# Patient Record
Sex: Male | Born: 1965 | Race: White | Hispanic: No | Marital: Single
Health system: Western US, Academic
[De-identification: ages and names within clinical notes are randomized; demographics above are authoritative.]

---
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
## 2022-05-27 DIAGNOSIS — M25562 Pain in left knee: Secondary | ICD-10-CM | POA: Insufficient documentation

## 2022-05-27 DIAGNOSIS — R0789 Other chest pain: Secondary | ICD-10-CM | POA: Insufficient documentation

## 2022-05-27 DIAGNOSIS — G8929 Other chronic pain: Secondary | ICD-10-CM | POA: Insufficient documentation

## 2022-05-27 DIAGNOSIS — S8002XA Contusion of left knee, initial encounter: Secondary | ICD-10-CM | POA: Insufficient documentation

## 2022-05-28 ENCOUNTER — Emergency Department
Admission: EM | Admit: 2022-05-28 | Discharge: 2022-05-28 | Disposition: A | Discharge status: Home / Self Care | Payer: Medicaid HMO | Attending: Student in an Organized Health Care Education/Training Program | Admitting: Student in an Organized Health Care Education/Training Program

## 2022-05-28 ENCOUNTER — Ambulatory Visit (HOSPITAL_BASED_OUTPATIENT_CLINIC_OR_DEPARTMENT_OTHER): Payer: Medicaid - Out of State

## 2022-05-28 ENCOUNTER — Ambulatory Visit: Payer: Medicaid - Out of State | Attending: Student in an Organized Health Care Education/Training Program

## 2022-05-28 DIAGNOSIS — R0789 Other chest pain: Secondary | ICD-10-CM

## 2022-05-28 DIAGNOSIS — S8992XA Unspecified injury of left lower leg, initial encounter: Secondary | ICD-10-CM | POA: Insufficient documentation

## 2022-05-28 DIAGNOSIS — Z043 Encounter for examination and observation following other accident: Secondary | ICD-10-CM

## 2022-05-28 DIAGNOSIS — S4991XA Unspecified injury of right shoulder and upper arm, initial encounter: Secondary | ICD-10-CM | POA: Insufficient documentation

## 2022-05-28 DIAGNOSIS — M25569 Pain in unspecified knee: Secondary | ICD-10-CM

## 2022-05-28 MED ORDER — IBUPROFEN 600 MG OR TABS
600.0000 mg | ORAL_TABLET | Freq: Once | ORAL | Status: AC
Start: 2022-05-28 — End: 2022-05-28
  Administered 2022-05-28: 600 mg via ORAL
  Filled 2022-05-28: qty 1

## 2022-05-28 MED ORDER — ACETAMINOPHEN 325 MG PO TABS
975.0000 mg | ORAL_TABLET | Freq: Once | ORAL | Status: AC
Start: 2022-05-28 — End: 2022-05-28
  Administered 2022-05-28: 975 mg via ORAL
  Filled 2022-05-28: qty 3

## 2022-05-28 NOTE — Discharge Instructions (Addendum)
Use Rest, ice, compression, elevation for your knee. Use tylenol and ibuprofen as needed for pain. Follow up with your primary care doctor. Xrays were negative for fractures.
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
